# Patient Record
Sex: Female | Born: 1980 | Race: Black or African American | Hispanic: No | Marital: Single | Smoking: Current every day smoker
Health system: Southern US, Community
[De-identification: ages and names within clinical notes are randomized; demographics above are authoritative.]

## PROBLEM LIST (undated history)

## (undated) ENCOUNTER — Emergency Department (HOSPITAL_COMMUNITY): Admission: EM | Payer: 59 | Source: Home / Self Care

---

## 2002-09-11 ENCOUNTER — Emergency Department (HOSPITAL_COMMUNITY): Admission: EM | Admit: 2002-09-11 | Discharge: 2002-09-11 | Payer: Self-pay | Admitting: Emergency Medicine

## 2005-05-13 ENCOUNTER — Emergency Department: Payer: Self-pay | Admitting: Emergency Medicine

## 2005-05-14 ENCOUNTER — Ambulatory Visit: Payer: Self-pay | Admitting: Emergency Medicine

## 2005-08-19 ENCOUNTER — Inpatient Hospital Stay: Payer: Self-pay

## 2005-11-09 ENCOUNTER — Observation Stay: Payer: Self-pay | Admitting: Unknown Physician Specialty

## 2005-11-21 ENCOUNTER — Observation Stay: Payer: Self-pay

## 2005-11-30 ENCOUNTER — Observation Stay: Payer: Self-pay

## 2005-12-07 ENCOUNTER — Observation Stay: Payer: Self-pay

## 2005-12-26 ENCOUNTER — Inpatient Hospital Stay: Payer: Self-pay | Admitting: Unknown Physician Specialty

## 2007-01-17 ENCOUNTER — Ambulatory Visit: Payer: Self-pay | Admitting: Emergency Medicine

## 2007-03-04 ENCOUNTER — Emergency Department: Payer: Self-pay | Admitting: Emergency Medicine

## 2007-05-29 ENCOUNTER — Emergency Department: Payer: Self-pay | Admitting: Emergency Medicine

## 2009-03-12 ENCOUNTER — Emergency Department: Payer: Self-pay | Admitting: Emergency Medicine

## 2010-06-12 ENCOUNTER — Emergency Department: Payer: Self-pay | Admitting: Emergency Medicine

## 2010-07-14 ENCOUNTER — Emergency Department: Payer: Self-pay | Admitting: Emergency Medicine

## 2012-08-16 ENCOUNTER — Emergency Department: Payer: Self-pay | Admitting: Emergency Medicine

## 2012-08-16 LAB — URINALYSIS, COMPLETE
Bilirubin,UR: NEGATIVE
Blood: NEGATIVE
Ketone: NEGATIVE
Leukocyte Esterase: NEGATIVE
Nitrite: NEGATIVE
Protein: NEGATIVE
RBC,UR: 1 /HPF (ref 0–5)
Squamous Epithelial: 4
WBC UR: 1 /HPF (ref 0–5)

## 2012-08-16 LAB — CBC
HCT: 37.8 % (ref 35.0–47.0)
MCV: 86 fL (ref 80–100)
Platelet: 267 10*3/uL (ref 150–440)
RBC: 4.39 10*6/uL (ref 3.80–5.20)
WBC: 11.2 10*3/uL — ABNORMAL HIGH (ref 3.6–11.0)

## 2012-08-16 LAB — HCG, QUANTITATIVE, PREGNANCY: Beta Hcg, Quant.: 30206 m[IU]/mL — ABNORMAL HIGH

## 2014-09-30 ENCOUNTER — Encounter: Payer: Self-pay | Admitting: Emergency Medicine

## 2014-09-30 ENCOUNTER — Emergency Department
Admission: EM | Admit: 2014-09-30 | Discharge: 2014-09-30 | Disposition: A | Discharge status: Home / Self Care | Payer: No Typology Code available for payment source | Attending: Emergency Medicine | Admitting: Emergency Medicine

## 2014-09-30 ENCOUNTER — Emergency Department: Payer: No Typology Code available for payment source

## 2014-09-30 DIAGNOSIS — Z720 Tobacco use: Secondary | ICD-10-CM | POA: Diagnosis not present

## 2014-09-30 DIAGNOSIS — R05 Cough: Secondary | ICD-10-CM | POA: Diagnosis present

## 2014-09-30 DIAGNOSIS — J209 Acute bronchitis, unspecified: Secondary | ICD-10-CM | POA: Insufficient documentation

## 2014-09-30 MED ORDER — HYDROCOD POLST-CPM POLST ER 10-8 MG/5ML PO SUER
ORAL | Status: AC
  Filled 2014-09-30: qty 5

## 2014-09-30 MED ORDER — GUAIFENESIN-CODEINE 100-10 MG/5ML PO SYRP: 5.0000 mL | ORAL_SOLUTION | Freq: Three times a day (TID) | ORAL | Status: DC | PRN

## 2014-09-30 MED ORDER — HYDROCOD POLST-CPM POLST ER 10-8 MG/5ML PO SUER
5.0000 mL | Freq: Once | ORAL | Status: AC
  Administered 2014-09-30: 5 mL via ORAL

## 2014-09-30 MED ORDER — AZITHROMYCIN 250 MG PO TABS: ORAL_TABLET | ORAL | Status: DC

## 2014-09-30 NOTE — Discharge Instructions (Signed)

## 2014-09-30 NOTE — ED Notes (Signed)
On steroids now 

## 2014-09-30 NOTE — ED Provider Notes (Signed)
[facility] Emergency Department Provider Note  ____________________________________________  Time seen: Approximately 9:28 AM  I have reviewed the triage vital signs and the nursing notes.   HISTORY  Chief Complaint Cough    HPI Monica Salas is a 34 y.o. female who presents to the emergency department with a six-day history of cough. She states that she went to the urgent care a couple days ago and was diagnosed with acute bronchitis. She was given a prescription for prednisone and an inhaler. She's had no relief with either of those and states that the cough has worsened over the weekend. She denies fever. She states that the cough induces vomiting after she eats. She states that the cough is much worse if she tries to lay down   History reviewed. No pertinent past medical history.  There are no active problems to display for this patient.   No past surgical history on file.  Current Outpatient Rx  Name  Route  Sig  Dispense  Refill  . azithromycin (ZITHROMAX Z-PAK) 250 MG tablet      Take 2 tablets (500 mg) on  Day 1,  followed by 1 tablet (250 mg) once daily on Days 2 through 5.   6 each   0   . guaiFENesin-codeine (CHERATUSSIN AC) 100-10 MG/5ML syrup   Oral   Take 5 mLs by mouth 3 (three) times daily as needed for cough.   120 mL   0     Allergies Review of patient's allergies indicates no known allergies.  No family history on file.  Social History History  Substance Use Topics  . Smoking status: Current Every Day Smoker -- 0.50 packs/day    Types: Cigarettes  . Smokeless tobacco: Not on file  . Alcohol Use: 1.2 oz/week    2 Standard drinks or equivalent per week    Review of Systems Constitutional: No fever/chills Eyes: No visual changes. ENT: No sore throat. Cardiovascular: Denies chest pain. Respiratory: Denies shortness of breath. Gastrointestinal: No abdominal pain. Nausea, no vomiting. No diarrhea. Genitourinary:  Negative for dysuria. Musculoskeletal: Negative for back pain. Skin: Negative for rash. Neurological: Negative for headaches, focal weakness or numbness.  10-point ROS otherwise negative.  ____________________________________________   PHYSICAL EXAM:  VITAL SIGNS: ED Triage Vitals  Enc Vitals Group     BP 09/30/14 0851 113/74 mmHg     Pulse Rate 09/30/14 0851 99     Resp 09/30/14 0851 20     Temp 09/30/14 0851 98.7 F (37.1 C)     Temp Source 09/30/14 0851 Oral     SpO2 09/30/14 0851 95 %     Weight 09/30/14 0851 179 lb (81.194 kg)     Height 09/30/14 0851 5' (1.524 m)     Head Cir --      Peak Flow --      Pain Score 09/30/14 0851 9     Pain Loc --      Pain Edu? --      Excl. in [place]? --     Constitutional: Alert and oriented. Well appearing and in no acute distress. Eyes: Conjunctivae are normal. PERRL. EOMI. Head: Atraumatic. Nose: No congestion/rhinnorhea. Mouth/Throat: Mucous membranes are moist.  Oropharynx non-erythematous. Neck: No stridor.   Cardiovascular: Normal rate, regular rhythm. Grossly normal heart sounds.  Good peripheral circulation. Respiratory: Normal respiratory effort.  No retractions. Lungs CTAB, diminished throughout. Gastrointestinal: Soft and nontender. No distention.  Musculoskeletal: No lower extremity tenderness nor edema.  No  joint effusions. Neurologic:  Normal speech and language. No gross focal neurologic deficits are appreciated. Speech is normal. No gait instability. Skin:  Skin is warm, dry and intact. No rash noted. Psychiatric: Mood and affect are normal. Speech and behavior are normal.  ____________________________________________   LABS (all labs ordered are listed, but only abnormal results are displayed)  Labs Reviewed - No data to display ____________________________________________  EKG   ____________________________________________  RADIOLOGY  No indication of  infection. ____________________________________________   PROCEDURES  Procedure(s) performed: None  Critical Care performed: No  ____________________________________________   INITIAL IMPRESSION / ASSESSMENT AND PLAN / ED COURSE  Pertinent labs & imaging results that were available during my care of the patient were reviewed by me and considered in my medical decision making (see chart for details).  Initial impression: Acute bronchitis. We will do chest x-ray to rule out pneumonia.  Patient was advised to schedule a follow-up appointment with her primary care provider in 2-3 days. She was advised to return the emergency department for symptoms that change or worsen if she is unable schedule an appointment. She was also advised to continue the prednisone and the inhaler that was prescribed at the urgent care.  ____________________________________________   FINAL CLINICAL IMPRESSION(S) / ED DIAGNOSES  Final diagnoses:  Acute bronchitis, unspecified organism      Monica Dike, FNP 09/30/14 1008  Monica Kitten, MD 09/30/14 [phone]

## 2014-09-30 NOTE — ED Notes (Signed)
Pt states that she has been having consistent cough for the past week , started around Short day weekend. Pt states that she was having popcorn the week before memorial day weekend and she felt she was choking on the popcorn but it finally went down. Since last week she has been having the cough with nausea and vomiting. Pt states that  Last friday she went to urgent care and was prescribed prednisone and since then she thinks the cough got worse.

## 2015-12-05 ENCOUNTER — Emergency Department: Payer: Self-pay

## 2015-12-05 ENCOUNTER — Emergency Department
Admission: EM | Admit: 2015-12-05 | Discharge: 2015-12-05 | Disposition: A | Discharge status: Home / Self Care | Payer: Self-pay | Attending: Emergency Medicine | Admitting: Emergency Medicine

## 2015-12-05 ENCOUNTER — Encounter: Payer: Self-pay | Admitting: Emergency Medicine

## 2015-12-05 DIAGNOSIS — R102 Pelvic and perineal pain: Secondary | ICD-10-CM

## 2015-12-05 DIAGNOSIS — D259 Leiomyoma of uterus, unspecified: Secondary | ICD-10-CM | POA: Insufficient documentation

## 2015-12-05 DIAGNOSIS — Z79899 Other long term (current) drug therapy: Secondary | ICD-10-CM | POA: Insufficient documentation

## 2015-12-05 DIAGNOSIS — N83201 Unspecified ovarian cyst, right side: Secondary | ICD-10-CM | POA: Insufficient documentation

## 2015-12-05 DIAGNOSIS — N938 Other specified abnormal uterine and vaginal bleeding: Secondary | ICD-10-CM

## 2015-12-05 DIAGNOSIS — F17210 Nicotine dependence, cigarettes, uncomplicated: Secondary | ICD-10-CM | POA: Insufficient documentation

## 2015-12-05 LAB — CBC
HCT: 36.6 % (ref 35.0–47.0)
HEMATOCRIT: 34.8 % — AB (ref 35.0–47.0)
HEMOGLOBIN: 12 g/dL (ref 12.0–16.0)
HEMOGLOBIN: 12 g/dL (ref 12.0–16.0)
MCH: 27.8 pg (ref 26.0–34.0)
MCH: 28.6 pg (ref 26.0–34.0)
MCHC: 32.8 g/dL (ref 32.0–36.0)
MCHC: 34.4 g/dL (ref 32.0–36.0)
MCV: 84.9 fL (ref 80.0–100.0)
MCV: 83.1 fL (ref 80.0–100.0)
PLATELETS: 226 10*3/uL (ref 150–440)
Platelets: 218 10*3/uL (ref 150–440)
RBC: 4.32 MIL/uL (ref 3.80–5.20)
RBC: 4.19 MIL/uL (ref 3.80–5.20)
RDW: 16.1 % — ABNORMAL HIGH (ref 11.5–14.5)
RDW: 16.2 % — ABNORMAL HIGH (ref 11.5–14.5)
WBC: 8.9 10*3/uL (ref 3.6–11.0)
WBC: 10 10*3/uL (ref 3.6–11.0)

## 2015-12-05 LAB — COMPREHENSIVE METABOLIC PANEL
ALK PHOS: 60 U/L (ref 38–126)
ALT: 14 U/L (ref 14–54)
ANION GAP: 7 (ref 5–15)
AST: 17 U/L (ref 15–41)
Albumin: 3.4 g/dL — ABNORMAL LOW (ref 3.5–5.0)
BUN: 9 mg/dL (ref 6–20)
CALCIUM: 8.7 mg/dL — AB (ref 8.9–10.3)
CO2: 23 mmol/L (ref 22–32)
Chloride: 107 mmol/L (ref 101–111)
Creatinine, Ser: 0.54 mg/dL (ref 0.44–1.00)
GFR calc non Af Amer: >60 mL/min (ref >60)
Glucose, Bld: 89 mg/dL (ref 65–99)
Potassium: 3.7 mmol/L (ref 3.5–5.1)
SODIUM: 137 mmol/L (ref 135–145)
Total Bilirubin: 0.2 mg/dL — ABNORMAL LOW (ref 0.3–1.2)
Total Protein: 6.5 g/dL (ref 6.5–8.1)

## 2015-12-05 LAB — WET PREP, GENITAL
Clue Cells Wet Prep HPF POC: NONE SEEN
Sperm: NONE SEEN
Trich, Wet Prep: NONE SEEN
YEAST WET PREP: NONE SEEN

## 2015-12-05 LAB — CHLAMYDIA/NGC RT PCR (ARMC ONLY)
Chlamydia Tr: NOT DETECTED
N GONORRHOEAE: NOT DETECTED

## 2015-12-05 LAB — TROPONIN I

## 2015-12-05 LAB — POCT PREGNANCY, URINE: PREG TEST UR: NEGATIVE

## 2015-12-05 MED ORDER — SODIUM CHLORIDE 0.9 % IV BOLUS (SEPSIS)
1000.0000 mL | Freq: Once | INTRAVENOUS | Status: AC
  Administered 2015-12-05: 1000 mL via INTRAVENOUS

## 2015-12-05 MED ORDER — MORPHINE SULFATE (PF) 4 MG/ML IV SOLN
4.0000 mg | Freq: Once | INTRAVENOUS | Status: AC
  Administered 2015-12-05: 4 mg via INTRAVENOUS

## 2015-12-05 MED ORDER — MORPHINE SULFATE (PF) 4 MG/ML IV SOLN
INTRAVENOUS | Status: AC
  Administered 2015-12-05: 4 mg via INTRAVENOUS
  Filled 2015-12-05: qty 1

## 2015-12-05 MED ORDER — ONDANSETRON HCL 4 MG/2ML IJ SOLN
4.0000 mg | Freq: Once | INTRAMUSCULAR | Status: AC
  Administered 2015-12-05: 4 mg via INTRAVENOUS

## 2015-12-05 MED ORDER — MEDROXYPROGESTERONE ACETATE 10 MG PO TABS
10.0000 mg | ORAL_TABLET | Freq: Every day | ORAL | Status: DC
  Administered 2015-12-05: 10 mg via ORAL
  Filled 2015-12-05: qty 1

## 2015-12-05 MED ORDER — TRAMADOL HCL 50 MG PO TABS: 50.0000 mg | ORAL_TABLET | Freq: Four times a day (QID) | ORAL | 0 refills | Status: AC | PRN

## 2015-12-05 MED ORDER — ONDANSETRON HCL 4 MG/2ML IJ SOLN
INTRAMUSCULAR | Status: AC
Start: 2015-12-05 — End: 2015-12-05
  Administered 2015-12-05: 4 mg via INTRAVENOUS
  Filled 2015-12-05: qty 2

## 2015-12-05 MED ORDER — MEDROXYPROGESTERONE ACETATE 10 MG PO TABS: 10.0000 mg | ORAL_TABLET | Freq: Every day | ORAL | 0 refills | Status: AC

## 2015-12-05 NOTE — ED Provider Notes (Signed)
[facility] Emergency Department Provider Note   ____________________________________________   First MD Initiated Contact with Patient 12/05/15 1652     (approximate)  I have reviewed the triage vital signs and the nursing notes.   HISTORY  Chief Complaint Vaginal Bleeding   HPI Monica Salas is a 35 y.o. female without any chronic medical problems was presenting to the emergency department today with vaginal bleeding. She said that the vaginal bleeding started yesterday with small dime size clots and has progressed today to tennis ball sized clots. She says the largest clots happen when she goes to the bathroom. She is also been soaking a pad about every hour. She says that she has begun to feel dizzy and lightheaded. Says that the only pain that she is having a lower abdomen which is intermittent and cramping. Says that she has a family history of fibroids.She reports her pain as a 7 out of 10.   History reviewed. No pertinent past medical history.  There are no active problems to display for this patient.   History reviewed. No pertinent surgical history.  Prior to Admission medications   Medication Sig Start Date End Date Taking? Authorizing Provider  medroxyPROGESTERone (DEPO-PROVERA) 150 MG/ML injection Inject 150 mg into the muscle every 3 (three) months.  06/30/15  Yes Historical Provider, MD    Allergies Review of patient's allergies indicates no known allergies.  History reviewed. No pertinent family history.  Social History Social History  Substance Use Topics  . Smoking status: Current Every Day Smoker    Packs/day: 0.50    Types: Cigarettes  . Smokeless tobacco: Not on file  . Alcohol use 1.2 oz/week    2 Standard drinks or equivalent per week    Review of Systems Constitutional: No fever/chills Eyes: No visual changes. ENT: No sore throat. Cardiovascular: Denies chest pain. Respiratory: Denies shortness of  breath. Gastrointestinal:No nausea, no vomiting.  No diarrhea.  No constipation. Genitourinary: Negative for dysuria. Musculoskeletal: Negative for back pain. Skin: Negative for rash. Neurological: Negative for headaches, focal weakness or numbness.  10-point ROS otherwise negative.  ____________________________________________   PHYSICAL EXAM:  VITAL SIGNS: ED Triage Vitals  Enc Vitals Group     BP 12/05/15 1639 119/79     Pulse Rate 12/05/15 1639 90     Resp 12/05/15 1639 16     Temp 12/05/15 1639 98.5 F (36.9 C)     Temp Source 12/05/15 1639 Oral     SpO2 12/05/15 1639 98 %     Weight 12/05/15 1640 175 lb (79.4 kg)     Height 12/05/15 1640 5' (1.524 m)     Head Circumference --      Peak Flow --      Pain Score 12/05/15 1640 2     Pain Loc --      Pain Edu? --      Excl. in [place]? --     Constitutional: Alert and oriented. Well appearing and in no acute distress. Eyes: Conjunctivae are normal. PERRL. EOMI. Head: Atraumatic. Nose: No congestion/rhinnorhea. Mouth/Throat: Mucous membranes are moist.   Neck: No stridor.   Cardiovascular: Normal rate, regular rhythm. Grossly normal heart sounds.   Respiratory: Normal respiratory effort.  No retractions. Lungs CTAB. Gastrointestinal: Soft and nontender. No distention.  Genitourinary: Normal external appearance. Speculum exam with pooling of blood with a small amount of clot. Able to remove the blood and clot with a large Q-tip and there is a slow ooze  of bright red blood from the cervical os. On bimanual exam there is very mild cervical motion tenderness with associated mild uterine tenderness. No bogginess. No adnexal tenderness nor masses. Musculoskeletal: No lower extremity tenderness nor edema.  No joint effusions. Neurologic:  Normal speech and language. No gross focal neurologic deficits are appreciated. No gait instability. Skin:  Skin is warm, dry and intact. No rash noted. Psychiatric: Mood and affect are normal.  Speech and behavior are normal.  ____________________________________________   LABS (all labs ordered are listed, but only abnormal results are displayed)  Labs Reviewed  WET PREP, GENITAL - Abnormal; Notable for the following:       Result Value   WBC, Wet Prep HPF POC FEW (*)    All other components within normal limits  CBC - Abnormal; Notable for the following:    RDW 16.1 (*)    All other components within normal limits  COMPREHENSIVE METABOLIC PANEL - Abnormal; Notable for the following:    Calcium 8.7 (*)    Albumin 3.4 (*)    Total Bilirubin 0.2 (*)    All other components within normal limits  CBC - Abnormal; Notable for the following:    HCT 34.8 (*)    RDW 16.2 (*)    All other components within normal limits  CHLAMYDIA/NGC RT PCR ([facility] ONLY)  TROPONIN I  POC URINE PREG, ED  POCT PREGNANCY, URINE  CBG MONITORING, ED   ____________________________________________  EKG  ED ECG REPORT I, Doran Stabler, the attending physician, personally viewed and interpreted this ECG.   Date: 12/05/2015  EKG Time: 1650  Rate: 89  Rhythm: normal sinus rhythm  Axis: Normal axis  Intervals:none  ST&T Change: T wave inversions in 1 as well as aVL. No ST elevations nor depressions.  No previous for comparison. ____________________________________________  RADIOLOGY  US Transvaginal Non-OB (Accession [id number]) (Order [id number])  Imaging  Date: 12/05/2015 Department: [facility] EMERGENCY DEPARTMENT Released By/Authorizing: Orbie Pyo, MD (auto-released)  PACS Images   Show images for US Transvaginal Non-OB  Study Result   CLINICAL DATA:  Initial evaluation for acute pelvic pain, vaginal bleeding with clots. History C-section. Negative urine pregnancy. Uterus and ovaries  EXAM: TRANSABDOMINAL AND TRANSVAGINAL ULTRASOUND OF PELVIS  DOPPLER ULTRASOUND OF OVARIES  TECHNIQUE: Both transabdominal and transvaginal  ultrasound examinations of the pelvis were performed. Transabdominal technique was performed for global imaging of the pelvis including uterus, ovaries, adnexal regions, and pelvic cul-de-sac.  It was necessary to proceed with endovaginal exam following the transabdominal exam to visualize the uterus and ovaries. Color and duplex Doppler ultrasound was utilized to evaluate blood flow to the ovaries.  COMPARISON:  Prior ultrasound from 08/16/2012.  FINDINGS: Uterus  Measurements: 10.9 x 6.1 x 7.5 cm. The uterus was somewhat heterogeneous with 2 fibroid present. One fibroid was positioned in the fundus and measured 1.3 x 1 2 x 1.5 cm. Second fibroid position within the posterior uterine body and measured 2.0 x 1.4 x 1.7 cm. C-section scar noted.  Endometrium  Thickness: 6.1 mm.  No focal abnormality visualized.  Right ovary  Measurements: 4.1 x 2.3 x 2.1 cm. Normal appearance/no adnexal mass. 3.4 x 1.8 x 1.8 cm anechoic cyst, likely a normal physiologic cyst. No internal vascularity.  Left ovary  Not visualized.  Pulsed Doppler evaluation of the right ovary demonstrates normal low-resistance arterial and venous waveforms.  Other findings  No abnormal free fluid.  IMPRESSION: 1. 3.4 x 1.8 x 1.8  cm right ovarian cyst, likely a normal physiologic cyst. Otherwise normal sonographic appearance of the right ovary. No evidence for torsion. 2. Nonvisualization of the left ovary. 3. Fibroid uterus as detailed above.   Electronically Signed   By: Jeannine Boga M.D.   On: 12/05/2015 21:14    ____________________________________________   PROCEDURES  Procedure(s) performed:   Procedures  Critical Care performed:   ____________________________________________   INITIAL IMPRESSION / ASSESSMENT AND PLAN / ED COURSE  Pertinent labs & imaging results that were available during my care of the patient were reviewed by me and considered in my  medical decision making (see chart for details).  ----------------------------------------- 10:20 PM on 12/05/2015 -----------------------------------------  Patient was stable hemoglobin. Ultrasound with right-sided ovarian cyst as well as to fibroids. Bleeding likely from the fibroids. I did discuss the case with Dr.Staebler of [facility] with the patient has been seen before and he recommends Provera 10 mg daily for 30 days. I discussed the ultrasound findings as well as the plan with the patient for Provera and to follow-up in the clinic and she is understanding and willing to comply. We will give the first dose of Provera here prior to discharge.  Clinical Course     ____________________________________________   FINAL CLINICAL IMPRESSION(S) / ED DIAGNOSES  Final diagnoses:  Pelvic pain in female  Pelvic pain in female  Dysfunctional uterine bleeding. Fibroids. Ovarian cyst.    NEW MEDICATIONS STARTED DURING THIS VISIT:  New Prescriptions   No medications on file     Note:  This document was prepared using Dragon voice recognition software and may include unintentional dictation errors.    Orbie Pyo, MD 12/05/15 2221

## 2015-12-05 NOTE — ED Triage Notes (Addendum)
Pt c/o vaginal bleeding. Should be time for period. Has had clots size of tennis balls. using 1 pad per hour and has started with dizziness today.

## 2015-12-05 NOTE — ED Notes (Signed)
Patient transported to Ultrasound 

## 2015-12-05 NOTE — ED Notes (Signed)
Pt awaiting medication from pharmacy

## 2015-12-11 DIAGNOSIS — N921 Excessive and frequent menstruation with irregular cycle: Secondary | ICD-10-CM | POA: Diagnosis not present

## 2015-12-31 DIAGNOSIS — B9689 Other specified bacterial agents as the cause of diseases classified elsewhere: Secondary | ICD-10-CM | POA: Diagnosis not present

## 2015-12-31 DIAGNOSIS — J208 Acute bronchitis due to other specified organisms: Secondary | ICD-10-CM | POA: Diagnosis not present

## 2016-01-09 DIAGNOSIS — Z30430 Encounter for insertion of intrauterine contraceptive device: Secondary | ICD-10-CM | POA: Diagnosis not present

## 2016-01-21 DIAGNOSIS — H52223 Regular astigmatism, bilateral: Secondary | ICD-10-CM | POA: Diagnosis not present

## 2016-06-28 ENCOUNTER — Encounter: Payer: Self-pay | Admitting: Emergency Medicine

## 2016-06-28 ENCOUNTER — Emergency Department: Payer: 59

## 2016-06-28 ENCOUNTER — Emergency Department
Admission: EM | Admit: 2016-06-28 | Discharge: 2016-06-28 | Disposition: A | Discharge status: Home / Self Care | Payer: 59 | Attending: Emergency Medicine | Admitting: Emergency Medicine

## 2016-06-28 DIAGNOSIS — F17210 Nicotine dependence, cigarettes, uncomplicated: Secondary | ICD-10-CM | POA: Insufficient documentation

## 2016-06-28 DIAGNOSIS — D649 Anemia, unspecified: Secondary | ICD-10-CM | POA: Insufficient documentation

## 2016-06-28 DIAGNOSIS — R600 Localized edema: Secondary | ICD-10-CM | POA: Diagnosis not present

## 2016-06-28 DIAGNOSIS — R0602 Shortness of breath: Secondary | ICD-10-CM | POA: Diagnosis not present

## 2016-06-28 LAB — CBC
HEMATOCRIT: 29.7 % — AB (ref 35.0–47.0)
Hemoglobin: 9.4 g/dL — ABNORMAL LOW (ref 12.0–16.0)
MCH: 22.4 pg — AB (ref 26.0–34.0)
MCHC: 31.6 g/dL — ABNORMAL LOW (ref 32.0–36.0)
MCV: 70.9 fL — ABNORMAL LOW (ref 80.0–100.0)
Platelets: 295 10*3/uL (ref 150–440)
RBC: 4.19 MIL/uL (ref 3.80–5.20)
RDW: 18.3 % — ABNORMAL HIGH (ref 11.5–14.5)
WBC: 8.4 10*3/uL (ref 3.6–11.0)

## 2016-06-28 LAB — BASIC METABOLIC PANEL
Anion gap: 9 (ref 5–15)
BUN: 11 mg/dL (ref 6–20)
CHLORIDE: 109 mmol/L (ref 101–111)
CO2: 21 mmol/L — ABNORMAL LOW (ref 22–32)
Calcium: 8.4 mg/dL — ABNORMAL LOW (ref 8.9–10.3)
Creatinine, Ser: 0.48 mg/dL (ref 0.44–1.00)
GFR calc Af Amer: >60 mL/min (ref >60)
GFR calc non Af Amer: >60 mL/min (ref >60)
GLUCOSE: 121 mg/dL — AB (ref 65–99)
POTASSIUM: 3.7 mmol/L (ref 3.5–5.1)
Sodium: 139 mmol/L (ref 135–145)

## 2016-06-28 LAB — BRAIN NATRIURETIC PEPTIDE: B Natriuretic Peptide: 42 pg/mL (ref 0.0–100.0)

## 2016-06-28 LAB — TROPONIN I: Troponin I: <0.03 ng/mL (ref <0.03)

## 2016-06-28 MED ORDER — IOPAMIDOL (ISOVUE-370) INJECTION 76%
75.0000 mL | Freq: Once | INTRAVENOUS | Status: AC | PRN
  Administered 2016-06-28: 75 mL via INTRAVENOUS

## 2016-06-28 MED ORDER — ACETAMINOPHEN 325 MG PO TABS
650.0000 mg | ORAL_TABLET | Freq: Once | ORAL | Status: AC
  Administered 2016-06-28: 650 mg via ORAL
  Filled 2016-06-28: qty 2

## 2016-06-28 NOTE — ED Notes (Signed)
Patient hooked back up to VS machine. NAD.

## 2016-06-28 NOTE — ED Triage Notes (Addendum)
Woke up Saturday morning and feet were tingling.  Patient went about her day and then noticed swelling to lower legs and feet.  This morning noticed pain with weight bearing to both legs and feet.  Sent to ED for evaluation from [facility].  Patient states last Saturday, took a methotrexate to terminate pregnancy.

## 2016-06-28 NOTE — ED Notes (Signed)
Patient transported to X-ray 

## 2016-06-28 NOTE — ED Notes (Signed)
Waiting on disposition

## 2016-06-28 NOTE — ED Notes (Signed)
Pt ambulated, O2 saturation level stayed 95-100%. Heart Rate remained 80-90. Dr. Corky Downs notified

## 2016-06-28 NOTE — ED Notes (Signed)
Patient transported to CT 

## 2016-06-28 NOTE — ED Provider Notes (Addendum)
[facility] Emergency Department Provider Note   ____________________________________________    I have reviewed the triage vital signs and the nursing notes.   HISTORY  Chief Complaint Shortness of breath and leg swelling    HPI Monica Salas is a 36 y.o. female who presents with shortness of breath and leg swelling for 2 days. One week ago patient took methotrexate to abort ten-week pregnancy. She had vaginal bleeding and cramping but it had mostly resolved by Saturday at which point she woke up feeling short of breath with bilateral lower extremity edema. No history of blood clots. No calf pain. She did have tingling in the bottom of both feet. She has a strong family history of diabetes but she has never been diagnosed. No recent travel.   History reviewed. No pertinent past medical history.  There are no active problems to display for this patient.   Past Surgical History:  Procedure Laterality Date  . CESAREAN SECTION      Prior to Admission medications   Medication Sig Start Date End Date Taking? Authorizing Provider  medroxyPROGESTERone (DEPO-PROVERA) 150 MG/ML injection Inject 150 mg into the muscle every 3 (three) months.  06/30/15   Historical Provider, MD  medroxyPROGESTERone (PROVERA) 10 MG tablet Take 1 tablet (10 mg total) by mouth daily. 12/05/15 12/04/16  Orbie Pyo, MD  traMADol (ULTRAM) 50 MG tablet Take 1 tablet (50 mg total) by mouth every 6 (six) hours as needed for moderate pain or severe pain. 12/05/15 12/04/16  Orbie Pyo, MD     Allergies Patient has no known allergies.  No family history on file.  Social History Social History  Substance Use Topics  . Smoking status: Current Every Day Smoker    Packs/day: 0.50    Types: Cigarettes  . Smokeless tobacco: Never Used  . Alcohol use 1.2 oz/week    2 Standard drinks or equivalent per week    Review of Systems  Constitutional: No  fever/chills   Cardiovascular: Denies chest pain. Respiratory: As above Gastrointestinal: No abdominal pain.  No nausea, no vomiting.   Genitourinary: Negative for dysuria. Musculoskeletal: Lower external swelling Skin: Negative for rash. Neurological: Negative for headaches  10-point ROS otherwise negative.  ____________________________________________   PHYSICAL EXAM:  VITAL SIGNS: ED Triage Vitals  Enc Vitals Group     BP 06/28/16 0854 129/85     Pulse Rate 06/28/16 0854 88     Resp 06/28/16 0854 18     Temp 06/28/16 0854 98.8 F (37.1 C)     Temp Source 06/28/16 0854 Oral     SpO2 06/28/16 0854 99 %     Weight 06/28/16 0855 176 lb (79.8 kg)     Height 06/28/16 0855 5' (1.524 m)     Head Circumference --      Peak Flow --      Pain Score 06/28/16 0857 0     Pain Loc --      Pain Edu? --      Excl. in [place]? --     Constitutional: Alert and oriented. No acute distress. Pleasant and interactive Eyes: Conjunctivae are normal.   Nose: No congestion/rhinnorhea. Mouth/Throat: Mucous membranes are moist.    Cardiovascular: Normal rate, regular rhythm. Grossly normal heart sounds.  Good peripheral circulation. Respiratory: Mildly increased respiratory effort.  No retractions. Lungs CTAB. Gastrointestinal: Soft and nontender. No distention.  No CVA tenderness. Genitourinary: deferred Musculoskeletal: Minimal edema bilateral feet.  Warm and well perfused  Neurologic:  Normal speech and language. No gross focal neurologic deficits are appreciated.  Skin:  Skin is warm, dry and intact. No rash noted. Psychiatric: Mood and affect are normal. Speech and behavior are normal.  ____________________________________________   LABS (all labs ordered are listed, but only abnormal results are displayed)  Labs Reviewed  CBC - Abnormal; Notable for the following:       Result Value   Hemoglobin 9.4 (*)    HCT 29.7 (*)    MCV 70.9 (*)    MCH 22.4 (*)    MCHC 31.6 (*)    RDW  18.3 (*)    All other components within normal limits  BASIC METABOLIC PANEL - Abnormal; Notable for the following:    CO2 21 (*)    Glucose, Bld 121 (*)    Calcium 8.4 (*)    All other components within normal limits  BRAIN NATRIURETIC PEPTIDE  TROPONIN I   ____________________________________________  EKG  ED ECG REPORT I, Lavonia Drafts, the attending physician, personally viewed and interpreted this ECG.  Date: 06/28/2016 EKG Time: 9:22 AM Rate: 90 Rhythm: normal sinus rhythm QRS Axis: normal Intervals: normal ST/T Wave abnormalities: normal Conduction Disturbances: none Narrative Interpretation: unremarkable  ____________________________________________  RADIOLOGY  Chest x-ray unremarkable ____________________________________________   PROCEDURES  Procedure(s) performed: No    Critical Care performed: No ____________________________________________   INITIAL IMPRESSION / ASSESSMENT AND PLAN / ED COURSE  Pertinent labs & imaging results that were available during my care of the patient were reviewed by me and considered in my medical decision making (see chart for details).  Patient presents with shortness of breath and bilateral lower extremity edema after methotrexate. Concern for cardiomyopathy. We will check x-ray, EKG, labs and monitor closely  Chest x-ray unremarkable. We will obtain CT angiography to evaluate for PE  CT scan unremarkable, ambulatory pulse ox patient felt well. Discussed results with her, she has outpatient follow-up arranged. She feels well currently and has no complaints. She was like to go home and I think this is reasonable given that she has outpatient follow-up and she knows she can return if any change in symptoms. Patient is slightly anemic, she is no longer bleeding but her shortness of breath may be related to blood loss. I discussed with the patient her thyroid nodules as well, she will discuss with her PCP      ____________________________________________   FINAL CLINICAL IMPRESSION(S) / ED DIAGNOSES  Final diagnoses:  Shortness of breath  Anemia    NEW MEDICATIONS STARTED DURING THIS VISIT:  New Prescriptions   No medications on file     Note:  This document was prepared using Dragon voice recognition software and may include unintentional dictation errors.    Lavonia Drafts, MD 06/28/16 1457    Lavonia Drafts, MD 06/28/16 [phone]

## 2016-07-01 DIAGNOSIS — Z309 Encounter for contraceptive management, unspecified: Secondary | ICD-10-CM | POA: Diagnosis not present

## 2016-07-01 DIAGNOSIS — D509 Iron deficiency anemia, unspecified: Secondary | ICD-10-CM | POA: Diagnosis not present

## 2016-07-01 DIAGNOSIS — N898 Other specified noninflammatory disorders of vagina: Secondary | ICD-10-CM | POA: Diagnosis not present

## 2016-07-01 DIAGNOSIS — G4700 Insomnia, unspecified: Secondary | ICD-10-CM | POA: Diagnosis not present

## 2016-11-26 IMAGING — US US TRANSVAGINAL NON-OB
1 series · 13 of 25 positions shown · non-contrast
Comparison: Prior ultrasound from 08/16/2012.

CLINICAL DATA: Initial evaluation for acute pelvic pain, vaginal
bleeding with clots. History C-section. Negative urine pregnancy.
Uterus and ovaries

EXAM:
TRANSABDOMINAL AND TRANSVAGINAL ULTRASOUND OF PELVIS
DOPPLER ULTRASOUND OF OVARIES
TECHNIQUE: Both transabdominal and transvaginal ultrasound examinations of the
pelvis were performed. Transabdominal technique was performed for
global imaging of the pelvis including uterus, ovaries, adnexal
regions, and pelvic cul-de-sac.
It was necessary to proceed with endovaginal exam following the
transabdominal exam to visualize the uterus and ovaries. Color and
duplex Doppler ultrasound was utilized to evaluate blood flow to the
ovaries.

[Series 1: us transvaginal non-ob · 0.22mm/px · 13 of 104 slices shown]
[im 1/104]
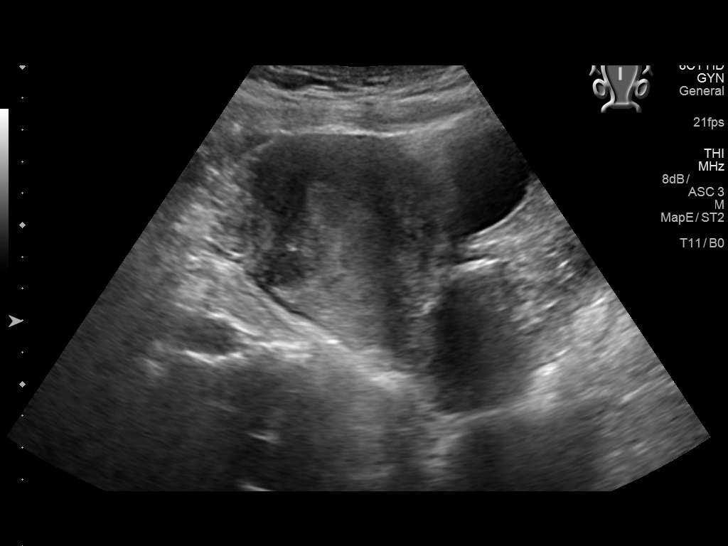
[im 9/104]
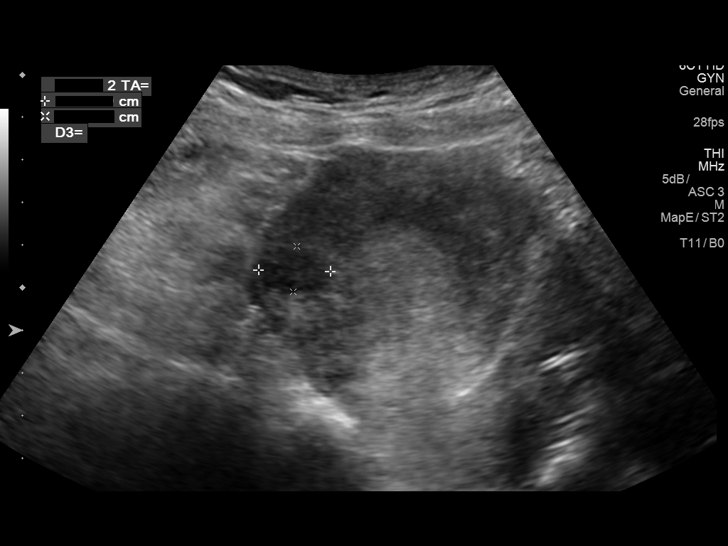
[im 18/104]
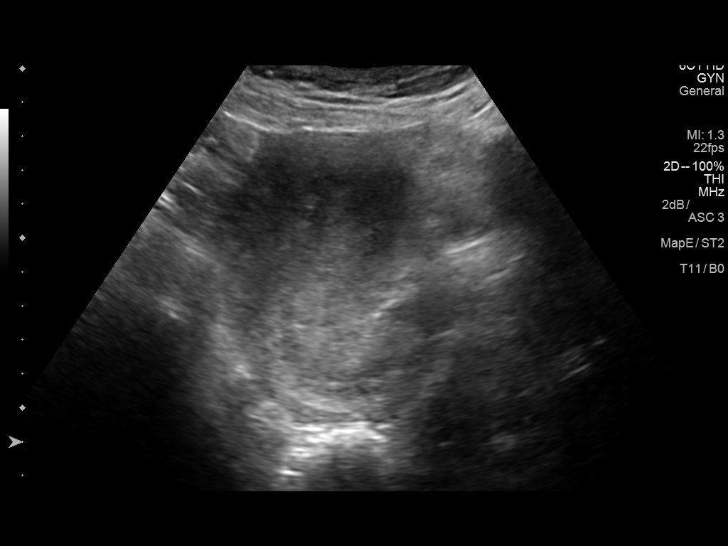
[im 26/104]
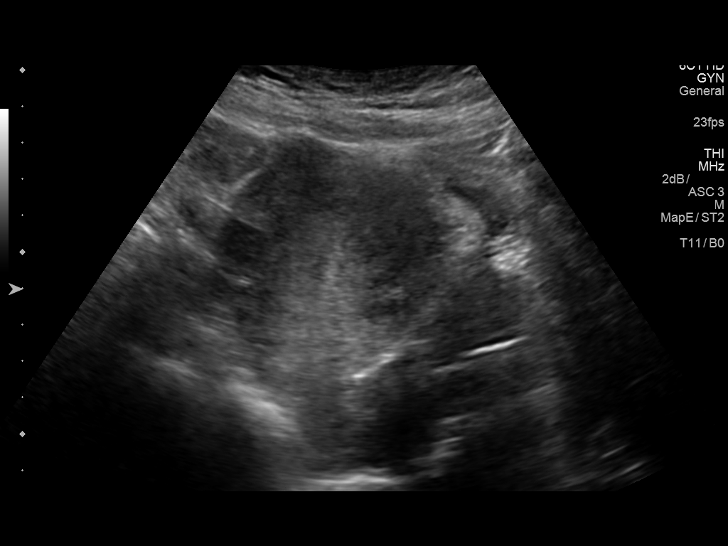
[im 35/104]
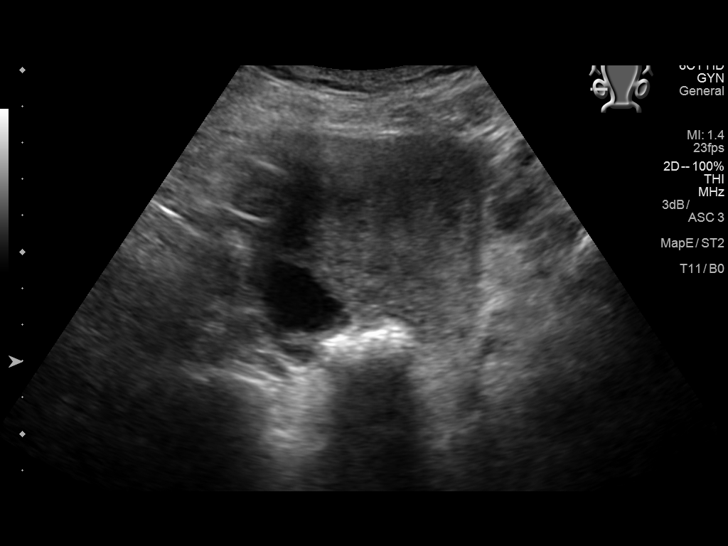
[im 43/104]
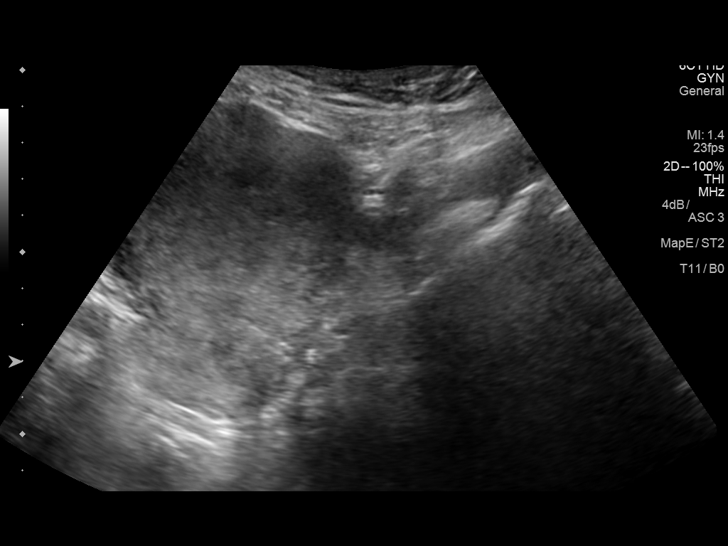
[im 52/104]
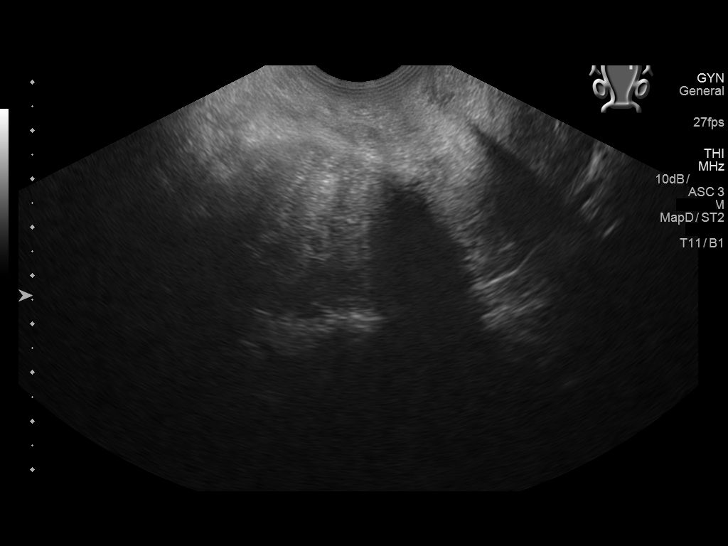
[im 61/104]
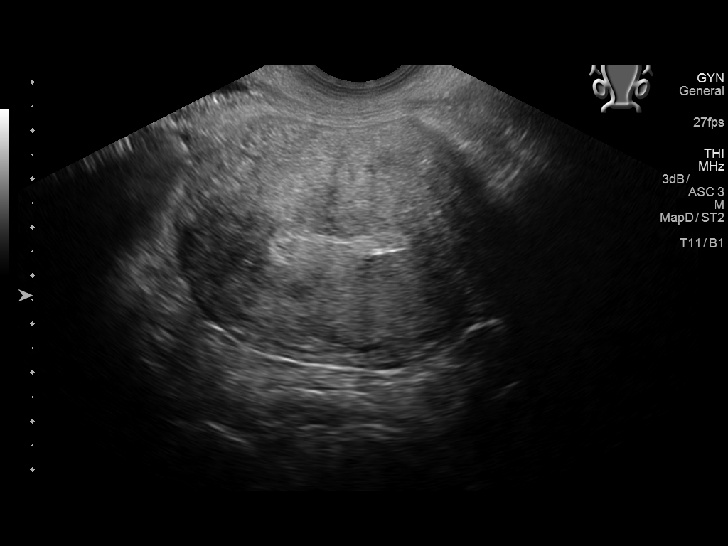
[im 69/104]
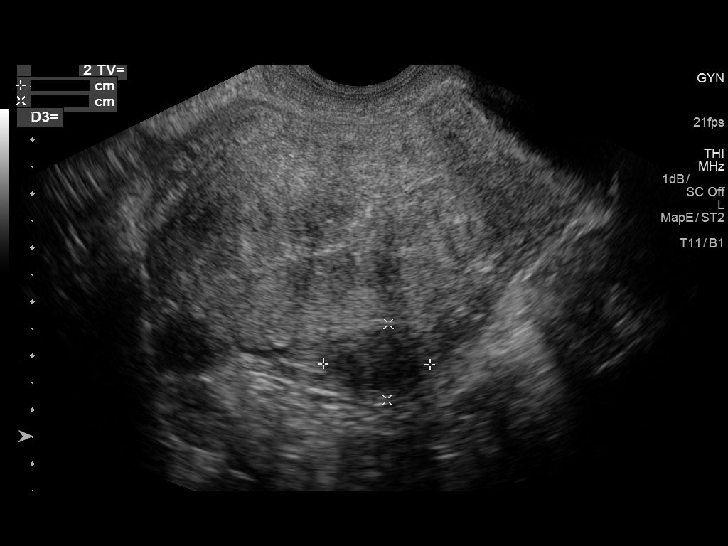
[im 78/104]
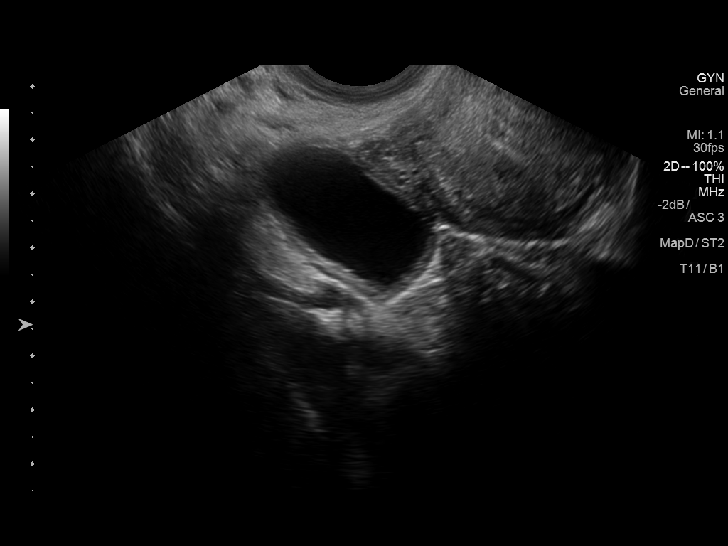
[im 86/104]
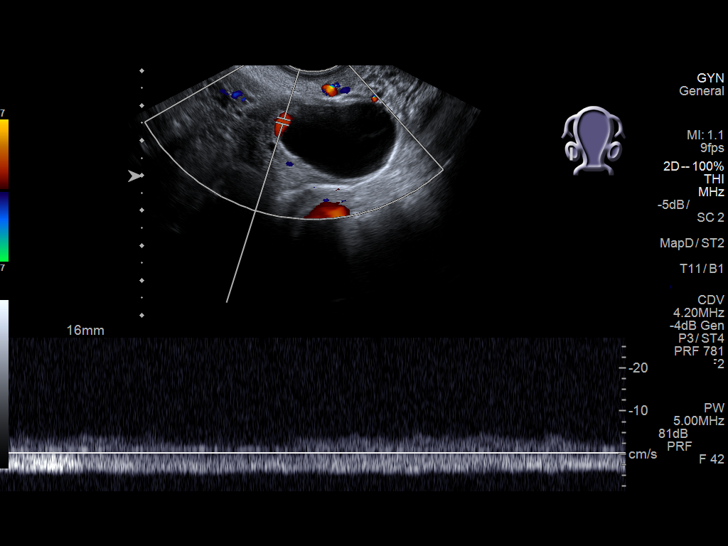
[im 95/104]
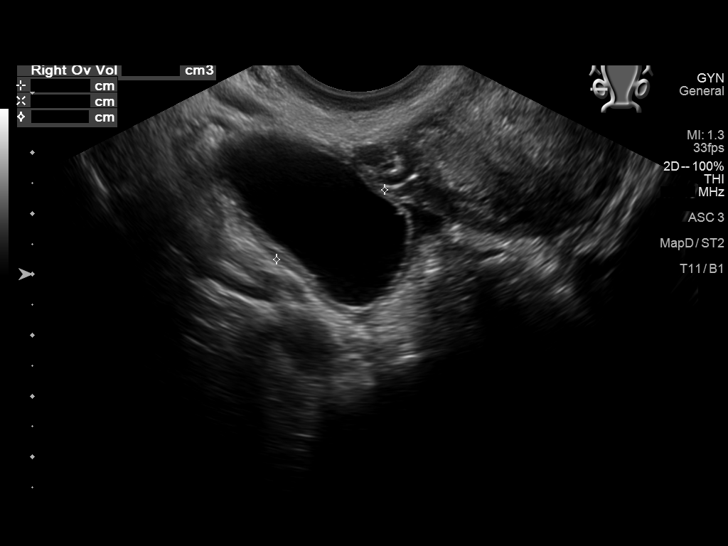
[im 104/104]
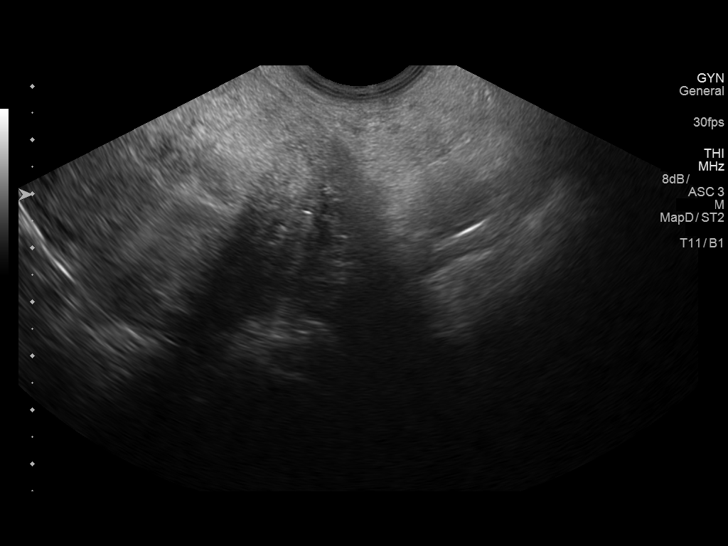

[13 of 25 positions shown; findings below may reference images not displayed]

FINDINGS: Uterus

Measurements: 10.9 x 6.1 x 7.5 cm. The uterus was somewhat
heterogeneous with 2 fibroid present. One fibroid was positioned in
the fundus and measured 1.3 x 1 2 x 1.5 cm. Second fibroid position
within the posterior uterine body and measured 2.0 x 1.4 x 1.7 cm.
C-section scar noted.

Endometrium

Thickness: 6.1 mm.  No focal abnormality visualized.

Right ovary

Measurements: 4.1 x 2.3 x 2.1 cm. Normal appearance/no adnexal mass.
3.4 x 1.8 x 1.8 cm anechoic cyst, likely a normal physiologic cyst.
No internal vascularity.

Left ovary

Not visualized.

Pulsed Doppler evaluation of the right ovary demonstrates normal
low-resistance arterial and venous waveforms.

Other findings

No abnormal free fluid.
IMPRESSION: 1. 3.4 x 1.8 x 1.8 cm right ovarian cyst, likely a normal
physiologic cyst. Otherwise normal sonographic appearance of the
right ovary. No evidence for torsion.
2. Nonvisualization of the left ovary.
3. Fibroid uterus as detailed above.

## 2017-01-18 ENCOUNTER — Encounter: Payer: Self-pay | Admitting: Medical Oncology

## 2017-01-18 ENCOUNTER — Emergency Department
Admission: EM | Admit: 2017-01-18 | Discharge: 2017-01-18 | Disposition: A | Discharge status: Home / Self Care | Payer: No Typology Code available for payment source | Attending: Emergency Medicine | Admitting: Emergency Medicine

## 2017-01-18 DIAGNOSIS — Z3A00 Weeks of gestation of pregnancy not specified: Secondary | ICD-10-CM | POA: Insufficient documentation

## 2017-01-18 DIAGNOSIS — O99331 Smoking (tobacco) complicating pregnancy, first trimester: Secondary | ICD-10-CM | POA: Insufficient documentation

## 2017-01-18 DIAGNOSIS — F17210 Nicotine dependence, cigarettes, uncomplicated: Secondary | ICD-10-CM | POA: Insufficient documentation

## 2017-01-18 DIAGNOSIS — Z3201 Encounter for pregnancy test, result positive: Secondary | ICD-10-CM | POA: Insufficient documentation

## 2017-01-18 LAB — URINALYSIS, COMPLETE (UACMP) WITH MICROSCOPIC
BACTERIA UA: NONE SEEN
Bilirubin Urine: NEGATIVE
GLUCOSE, UA: NEGATIVE mg/dL
HGB URINE DIPSTICK: NEGATIVE
KETONES UR: NEGATIVE mg/dL
Leukocytes, UA: NEGATIVE
NITRITE: NEGATIVE
Protein, ur: NEGATIVE mg/dL
Specific Gravity, Urine: 1.021 (ref 1.005–1.030)
pH: 6 (ref 5.0–8.0)

## 2017-01-18 LAB — POCT PREGNANCY, URINE
PREG TEST UR: POSITIVE — AB
Preg Test, Ur: POSITIVE — AB

## 2017-01-18 MED ORDER — PRENATAL VITAMINS 0.8 MG PO TABS: 1.0000 | ORAL_TABLET | Freq: Every day | ORAL | 0 refills | Status: AC

## 2017-01-18 NOTE — ED Provider Notes (Signed)
[facility] Emergency Department Provider Note  ____________________________________________  Time seen: Approximately 12:08 PM  I have reviewed the triage vital signs and the nursing notes.   HISTORY  Chief Complaint Back Pain    HPI Monica Salas is a 36 y.o. female that presents to emergency department for evaluation of low back pain and positive home pregnancy test 2 days ago. Last menstrual cycle was August 17. She is wondering if her pregnancy test was intact. No trauma to low back. Pain does not radiate. No bowel or bladder dysfunction or saddle paresthesias. She has a daughter that is 79. She is not concerned for an STD. No shortness of breath, chest pain, nausea, vomiting, abdominal pain, vaginal bleeding, vaginal discharge.   History reviewed. No pertinent past medical history.  There are no active problems to display for this patient.   Past Surgical History:  Procedure Laterality Date  . CESAREAN SECTION      Prior to Admission medications   Medication Sig Start Date End Date Taking? Authorizing Provider  medroxyPROGESTERone (DEPO-PROVERA) 150 MG/ML injection Inject 150 mg into the muscle every 3 (three) months.  06/30/15   [provider]  medroxyPROGESTERone (PROVERA) 10 MG tablet Take 1 tablet (10 mg total) by mouth daily. 12/05/15 12/04/16  Orbie Pyo, MD  Prenatal Multivit-Min-Fe-FA (PRENATAL VITAMINS) 0.8 MG tablet Take 1 tablet by mouth daily. 01/18/17   Laban Emperor, PA-C    Allergies Patient has no known allergies.  No family history on file.  Social History Social History  Substance Use Topics  . Smoking status: Current Every Day Smoker    Packs/day: 0.50    Types: Cigarettes  . Smokeless tobacco: Never Used  . Alcohol use 1.2 oz/week    2 Standard drinks or equivalent per week     Review of Systems  Constitutional: No fever/chills Cardiovascular: No chest pain. Respiratory: No  SOB. Gastrointestinal: No abdominal pain.  No nausea, no vomiting.  Musculoskeletal: Positive for back pain. Skin: Negative for rash, abrasions, lacerations, ecchymosis. Neurological: Negative for headaches, numbness or tingling   ____________________________________________   PHYSICAL EXAM:  VITAL SIGNS: ED Triage Vitals [01/18/17 1045]  Enc Vitals Group     BP 133/77     Pulse Rate 77     Resp 18     Temp 98.4 F (36.9 C)     Temp Source Oral     SpO2 97 %     Weight 186 lb (84.4 kg)     Height 5' (1.524 m)     Head Circumference      Peak Flow      Pain Score 10     Pain Loc      Pain Edu?      Excl. in [place]?      Constitutional: Alert and oriented. Well appearing and in no acute distress. Eyes: Conjunctivae are normal. PERRL. EOMI. Head: Atraumatic. ENT:      Ears:      Nose: No congestion/rhinnorhea.      Mouth/Throat: Mucous membranes are moist.  Neck: No stridor.   Cardiovascular: Normal rate, regular rhythm.  Good peripheral circulation. Respiratory: Normal respiratory effort without tachypnea or retractions. Lungs CTAB. Good air entry to the bases with no decreased or absent breath sounds. Gastrointestinal: Bowel sounds 4 quadrants. Soft and nontender to palpation. No guarding or rigidity. No palpable masses. No distention. No CVA tenderness. Musculoskeletal: Full range of motion to all extremities. No gross deformities appreciated. Tenderness to  palpation over lumbar muscles. Full range of motion of back. Neurologic:  Normal speech and language. No gross focal neurologic deficits are appreciated.  Skin:  Skin is warm, dry and intact. No rash noted.   ____________________________________________   LABS (all labs ordered are listed, but only abnormal results are displayed)  Labs Reviewed  URINALYSIS, COMPLETE (UACMP) WITH MICROSCOPIC - Abnormal; Notable for the following:       Result Value   Color, Urine YELLOW (*)    APPearance CLEAR (*)    Squamous  Epithelial / LPF 0-5 (*)    All other components within normal limits  POCT PREGNANCY, URINE - Abnormal; Notable for the following:    Preg Test, Ur POSITIVE (*)    All other components within normal limits  POCT PREGNANCY, URINE - Abnormal; Notable for the following:    Preg Test, Ur POSITIVE (*)    All other components within normal limits  POC URINE PREG, ED   ____________________________________________  EKG   ____________________________________________  RADIOLOGY   No results found.  ____________________________________________    PROCEDURES  Procedure(s) performed:    Procedures    Medications - No data to display   ____________________________________________   INITIAL IMPRESSION / ASSESSMENT AND PLAN / ED COURSE  Pertinent labs & imaging results that were available during my care of the patient were reviewed by me and considered in my medical decision making (see chart for details).  Review of the [place] CSRS was performed in accordance of the [place] prior to dispensing any controlled drugs.     She presented to the emergency department to confirm pregnancy test and for low back pain. Vital signs and exam are reassuring. Pregnancy test in ED was positive. She does have some tenderness to palpation over lumbar muscles. No trauma. No bowel or bladder dysfunction or saddle paresthesias. Urinalysis negative for infection. She will try warm compresses and follow up with her PCP. Patient will be discharged home with prescriptions for prenatal vitamins. Patient is to follow up with obstetrics as directed. Patient is given ED precautions to return to the ED for any worsening or new symptoms.     ____________________________________________  FINAL CLINICAL IMPRESSION(S) / ED DIAGNOSES  Final diagnoses:  Pregnancy test positive      NEW MEDICATIONS STARTED DURING THIS VISIT:  Discharge Medication List as of 01/18/2017 12:50 PM    START taking these  medications   Details  Prenatal Multivit-Min-Fe-FA (PRENATAL VITAMINS) 0.8 MG tablet Take 1 tablet by mouth daily., Starting Tue 01/18/2017, Print            This chart was dictated using voice recognition software/Dragon. Despite best efforts to proofread, errors can occur which can change the meaning. Any change was purely unintentional.    Laban Emperor, PA-C 01/18/17 1614    Delman Kitten, MD 01/22/17 [phone]

## 2017-01-18 NOTE — ED Notes (Signed)
Pt stating that she had a positive pregnancy test 2 days ago. Pt stating that she has been having "extreme" back pain for several days. Pt stating pain is across her lower back. Pt stating that she would describe that pain as stabbing and shooting up her back. Pt stating she did have numbness in her feet briefly "the other day."

## 2017-01-18 NOTE — ED Triage Notes (Signed)
Pt reports home positive preg test 2 days ago, since then has been having lower back pain. Denies other sx's.

## 2020-06-20 ENCOUNTER — Other Ambulatory Visit: Payer: Self-pay | Admitting: Neurology

## 2020-06-20 DIAGNOSIS — G4452 New daily persistent headache (NDPH): Secondary | ICD-10-CM

## 2020-06-25 ENCOUNTER — Other Ambulatory Visit: Payer: Self-pay | Admitting: Neurology

## 2020-06-25 DIAGNOSIS — G4452 New daily persistent headache (NDPH): Secondary | ICD-10-CM

## 2020-06-27 ENCOUNTER — Ambulatory Visit
Admission: RE | Admit: 2020-06-27 | Discharge: 2020-06-27 | Disposition: A | Discharge status: Home / Self Care | Payer: BC Managed Care – PPO | Source: Ambulatory Visit | Attending: Neurology | Admitting: Neurology

## 2020-06-27 ENCOUNTER — Other Ambulatory Visit: Payer: Self-pay

## 2020-06-27 DIAGNOSIS — G4452 New daily persistent headache (NDPH): Secondary | ICD-10-CM | POA: Insufficient documentation

## 2020-06-27 MED ORDER — GADOBUTROL 1 MMOL/ML IV SOLN
8.0000 mL | Freq: Once | INTRAVENOUS | Status: AC | PRN
  Administered 2020-06-27: 8 mL via INTRAVENOUS

## 2023-04-02 ENCOUNTER — Other Ambulatory Visit: Payer: Self-pay

## 2023-04-02 ENCOUNTER — Emergency Department
Admission: EM | Admit: 2023-04-02 | Discharge: 2023-04-02 | Disposition: A | Discharge status: Home / Self Care | Payer: BC Managed Care – PPO | Attending: Emergency Medicine | Admitting: Emergency Medicine

## 2023-04-02 DIAGNOSIS — T783XXA Angioneurotic edema, initial encounter: Secondary | ICD-10-CM | POA: Insufficient documentation

## 2023-04-02 DIAGNOSIS — T782XXA Anaphylactic shock, unspecified, initial encounter: Secondary | ICD-10-CM | POA: Diagnosis not present

## 2023-04-02 DIAGNOSIS — T7840XA Allergy, unspecified, initial encounter: Secondary | ICD-10-CM | POA: Diagnosis present

## 2023-04-02 MED ORDER — FAMOTIDINE IN NACL 20-0.9 MG/50ML-% IV SOLN: 20.0000 mg | Freq: Once | INTRAVENOUS | Status: AC

## 2023-04-02 MED ORDER — METHYLPREDNISOLONE SODIUM SUCC 125 MG IJ SOLR
125.0000 mg | Freq: Once | INTRAMUSCULAR | Status: AC
Start: 2023-04-02 — End: 2023-04-02
  Administered 2023-04-02: 125 mg via INTRAVENOUS
  Filled 2023-04-02: qty 2

## 2023-04-02 MED ORDER — EPINEPHRINE 0.3 MG/0.3ML IJ SOAJ
0.3000 mg | Freq: Once | INTRAMUSCULAR | Status: AC
Start: 2023-04-02 — End: 2023-04-02
  Administered 2023-04-02: 0.3 mg via INTRAMUSCULAR
  Filled 2023-04-02: qty 0.3

## 2023-04-02 MED ORDER — EPINEPHRINE 0.3 MG/0.3ML IJ SOAJ: 0.3000 mg | INTRAMUSCULAR | 0 refills | Status: AC | PRN

## 2023-04-02 MED ORDER — CETIRIZINE HCL 10 MG PO TABS: 10.0000 mg | ORAL_TABLET | Freq: Every day | ORAL | 2 refills | Status: AC | PRN

## 2023-04-02 MED ORDER — ONDANSETRON HCL 4 MG/2ML IJ SOLN
4.0000 mg | Freq: Once | INTRAMUSCULAR | Status: AC
  Administered 2023-04-02: 4 mg via INTRAVENOUS
  Filled 2023-04-02: qty 2

## 2023-04-02 MED ORDER — PREDNISONE 50 MG PO TABS: ORAL_TABLET | ORAL | 0 refills | Status: AC

## 2023-04-02 MED ORDER — FAMOTIDINE IN NACL 20-0.9 MG/50ML-% IV SOLN
INTRAVENOUS | Status: AC
  Administered 2023-04-02: 20 mg via INTRAVENOUS
  Filled 2023-04-02: qty 50

## 2023-04-02 NOTE — Discharge Instructions (Addendum)
Take medications as prescribed.  Follow-up with your doctor this week for reevaluation.  Should you experience any new worsening or expected symptoms come back to the emergency department for reevaluation.  Strawberries and all strawberry related products in the future.   Thank you for choosing [place] for your health care today!  Please see your primary doctor this week for a follow up appointment.   If you have any new, worsening, or unexpected symptoms call your doctor right away or come back to the emergency department for reevaluation.  It was my pleasure to care for you today.   Daneil Dan [place] Charon, MD

## 2023-04-02 NOTE — ED Triage Notes (Signed)
Pt to ED woke up with facial swelling. Reports not sure what caused. States allergic to strawberries, had strawberry margarita last night but has not caused issue in past Swelling to lips and cheeks. Reports took benadryl this morning but swelling continued to get worse

## 2023-04-02 NOTE — ED Notes (Signed)
Pt requested ice to place on face. RN gave ice for lip swelling.

## 2023-04-02 NOTE — ED Provider Notes (Addendum)
[facility] Provider Note    None    (approximate)   History   Allergic Reaction   HPI  Monica Salas is a 42 y.o. female   Past medical history of strawberry allergy, Graves' disease, depression, who presents to the emergency department with lip swelling, shortness of breath and nausea starting this morning.  She had a strawberry margarita last night and despite her strawberry allergy she states that strawberry syrup typically does not cause her to have a reaction.  She awoke this morning with swollen lips, mild shortness of breath.  She developed nausea while in the emergency department.  She took 50 mg of Benadryl orally before coming.  She has had no new exposures.   External Medical Documents Reviewed: [facility] health systems clinical summary for past medical history and medications      Physical Exam   Triage Vital Signs: ED Triage Vitals  Encounter Vitals Group     BP 04/02/23 1129 (!) 144/103     Systolic BP Percentile --      Diastolic BP Percentile --      Pulse Rate 04/02/23 1129 92     Resp 04/02/23 1129 20     Temp 04/02/23 1129 98 F (36.7 C)     Temp src --      SpO2 04/02/23 1129 100 %     Weight 04/02/23 1130 191 lb (86.6 kg)     Height 04/02/23 1130 5' (1.524 m)     Head Circumference --      Peak Flow --      Pain Score 04/02/23 1129 0     Pain Loc --      Pain Education --      Exclude from Growth Chart --     Most recent vital signs: Vitals:   04/02/23 1129  BP: (!) 144/103  Pulse: 92  Resp: 20  Temp: 98 F (36.7 C)  SpO2: 100%    General: Awake, no distress.  CV:  Good peripheral perfusion.  Resp:  Normal effort.  Abd:  No distention.  Other:   She appears to have swollen upper and lower lip, but posterior oropharynx looks normal, respirations intact airway intact with clear lungs and a soft benign abdominal exam.  Slightly hypertensive otherwise vital signs normal.   ED Results / Procedures /  Treatments   Labs (all labs ordered are listed, but only abnormal results are displayed) Labs Reviewed - No data to display   PROCEDURES:  Critical Care performed: Yes, see critical care procedure note(s)  .Critical Care  Performed by: Pilar Jarvis, MD Authorized by: Pilar Jarvis, MD   Critical care provider statement:    Critical care time (minutes):  30   Critical care was time spent personally by me on the following activities:  Development of treatment plan with patient or surrogate, discussions with consultants, evaluation of patient's response to treatment, examination of patient, ordering and review of laboratory studies, ordering and review of radiographic studies, ordering and performing treatments and interventions, pulse oximetry, re-evaluation of patient's condition and review of old charts    MEDICATIONS ORDERED IN ED: Medications  famotidine (PEPCID) IVPB 20 mg premix (20 mg Intravenous New Bag/Given 04/02/23 1151)  methylPREDNISolone sodium succinate (SOLU-MEDROL) 125 mg/2 mL injection 125 mg (125 mg Intravenous Given 04/02/23 1147)  EPINEPHrine (EPI-PEN) injection 0.3 mg (0.3 mg Intramuscular Given 04/02/23 1148)  ondansetron (ZOFRAN) injection 4 mg (4 mg Intravenous Given 04/02/23 1151)  IMPRESSION / MDM / ASSESSMENT AND PLAN / ED COURSE  I reviewed the triage vital signs and the nursing notes.                                Patient's presentation is most consistent with acute presentation with potential threat to life or bodily function.  Differential diagnosis includes, but is not limited to, anaphylaxis, angioedema, allergic reaction, airway obstruction   The patient is on the cardiac monitor to evaluate for evidence of arrhythmia and/or significant heart rate changes.  MDM:    Patient with swollen lips and multisystem involvement including nausea, shortness of breath after strawberry exposure with known strawberry allergy concerning for anaphylaxis  versus angioedema.  No ACE inhibitor use, no history of angioedema more likely allergic reaction due to strep exposure.  Given EpiPen, Pepcid, Solu-Medrol, and had just taken Benadryl prior to arrival.  Will need a period of observation for symptom resolution and no progression prior to disposition.   - Status at this time resting comfortably with moderate improvement in lip swelling, feels subjectively better, no longer nauseated, no longer short of breath swelling does appear to be improved but not completely normalized.  Will continue observation, if continued improvement plan will be for discharge.      FINAL CLINICAL IMPRESSION(S) / ED DIAGNOSES   Final diagnoses:  Angioedema, initial encounter  Anaphylaxis, initial encounter     Rx / DC Orders   ED Discharge Orders          Ordered    EPINEPHrine 0.3 mg/0.3 mL IJ SOAJ injection  As needed        04/02/23 1212    predniSONE (DELTASONE) 50 MG tablet        04/02/23 1212    cetirizine (ZYRTEC ALLERGY) 10 MG tablet  Daily PRN        04/02/23 1212             Note:  This document was prepared using Dragon voice recognition software and may include unintentional dictation errors.    Pilar Jarvis, MD 04/02/23 [id number]    Pilar Jarvis, MD 04/02/23 [phone]
